# Patient Record
Sex: Female | Born: 1960 | Race: Black or African American | Hispanic: No | Marital: Single
Health system: Southern US, Community
[De-identification: ages and names within clinical notes are randomized; demographics above are authoritative.]

---

## 2013-01-14 ENCOUNTER — Telehealth: Payer: Self-pay | Admitting: Hematology & Oncology

## 2013-01-14 NOTE — Telephone Encounter (Signed)
Left vm w NEW PATIENT today to remind them of their appointment with Dr. Ennever. Also, advised them to bring all meds and insurance information. ° °

## 2013-01-17 ENCOUNTER — Telehealth: Payer: Self-pay | Admitting: Hematology & Oncology

## 2013-01-17 ENCOUNTER — Ambulatory Visit: Payer: Self-pay

## 2013-01-17 ENCOUNTER — Other Ambulatory Visit: Payer: Self-pay | Admitting: Lab

## 2013-01-17 ENCOUNTER — Ambulatory Visit: Payer: Self-pay | Admitting: Hematology & Oncology

## 2013-01-17 NOTE — Telephone Encounter (Signed)
Pt called rescheduled 11-7 no show to 12-12

## 2013-02-20 ENCOUNTER — Telehealth: Payer: Self-pay | Admitting: Hematology & Oncology

## 2013-02-20 NOTE — Telephone Encounter (Signed)
I spoke w NEW PATIENT today to remind them of their appointment with Dr. Ennever. Also, advised them to bring all meds and insurance information. ° °

## 2013-02-21 ENCOUNTER — Ambulatory Visit (HOSPITAL_BASED_OUTPATIENT_CLINIC_OR_DEPARTMENT_OTHER)
Admission: RE | Admit: 2013-02-21 | Discharge: 2013-02-21 | Disposition: A | Discharge status: Home / Self Care | Payer: Medicare Other | Source: Ambulatory Visit | Attending: Hematology & Oncology | Admitting: Hematology & Oncology

## 2013-02-21 ENCOUNTER — Other Ambulatory Visit (HOSPITAL_COMMUNITY)
Admission: RE | Admit: 2013-02-21 | Discharge: 2013-02-21 | Disposition: A | Discharge status: Home / Self Care | Payer: Medicare Other | Source: Ambulatory Visit | Attending: Hematology & Oncology | Admitting: Hematology & Oncology

## 2013-02-21 ENCOUNTER — Ambulatory Visit (HOSPITAL_BASED_OUTPATIENT_CLINIC_OR_DEPARTMENT_OTHER): Payer: Medicare Other | Admitting: Hematology & Oncology

## 2013-02-21 ENCOUNTER — Other Ambulatory Visit (HOSPITAL_BASED_OUTPATIENT_CLINIC_OR_DEPARTMENT_OTHER): Payer: Medicare Other | Admitting: Lab

## 2013-02-21 ENCOUNTER — Ambulatory Visit: Payer: Medicare Other

## 2013-02-21 VITALS — BP 129/71 | HR 80 | Temp 98.4°F | Resp 14 | Ht 64.0 in | Wt 172.0 lb

## 2013-02-21 DIAGNOSIS — D72820 Lymphocytosis (symptomatic): Secondary | ICD-10-CM

## 2013-02-21 DIAGNOSIS — R05 Cough: Secondary | ICD-10-CM | POA: Insufficient documentation

## 2013-02-21 DIAGNOSIS — R059 Cough, unspecified: Secondary | ICD-10-CM | POA: Insufficient documentation

## 2013-02-21 DIAGNOSIS — F17200 Nicotine dependence, unspecified, uncomplicated: Secondary | ICD-10-CM

## 2013-02-21 DIAGNOSIS — D72829 Elevated white blood cell count, unspecified: Secondary | ICD-10-CM | POA: Insufficient documentation

## 2013-02-21 LAB — CBC WITH DIFFERENTIAL (CANCER CENTER ONLY)
BASO#: 0 10e3/uL (ref 0.0–0.2)
BASO%: 0.2 % (ref 0.0–2.0)
EOS%: 1.4 % (ref 0.0–7.0)
Eosinophils Absolute: 0.2 10e3/uL (ref 0.0–0.5)
HCT: 38 % (ref 34.8–46.6)
HGB: 12.1 g/dL (ref 11.6–15.9)
LYMPH#: 6 10e3/uL — ABNORMAL HIGH (ref 0.9–3.3)
LYMPH%: 39.6 % (ref 14.0–48.0)
MCH: 27 pg (ref 26.0–34.0)
MCHC: 31.8 g/dL — ABNORMAL LOW (ref 32.0–36.0)
MCV: 85 fL (ref 81–101)
MONO#: 0.8 10e3/uL (ref 0.1–0.9)
MONO%: 5.4 % (ref 0.0–13.0)
NEUT#: 8.2 10e3/uL — ABNORMAL HIGH (ref 1.5–6.5)
NEUT%: 53.4 % (ref 39.6–80.0)
Platelets: 278 10e3/uL (ref 145–400)
RBC: 4.48 10e6/uL (ref 3.70–5.32)
RDW: 15.3 % (ref 11.1–15.7)
WBC: 15.3 10e3/uL — ABNORMAL HIGH (ref 3.9–10.0)

## 2013-02-21 LAB — CHCC SATELLITE - SMEAR

## 2013-02-21 NOTE — Progress Notes (Signed)
This office note has been dictated.

## 2013-02-24 ENCOUNTER — Telehealth: Payer: Self-pay | Admitting: Nurse Practitioner

## 2013-02-24 NOTE — Telephone Encounter (Addendum)
Message copied by Glee Arvin on Mon Feb 24, 2013  5:48 PM ------      Message from: Josph Macho      Created: Sun Feb 23, 2013  8:38 PM       Call - normal cxr.  Pete ------Informed pt and she verbalized understanding and appreciation.

## 2013-02-24 NOTE — Progress Notes (Signed)
CC:   Karen Schmidt, MD  DIAGNOSIS:  Leukocytosis.  HISTORY OF PRESENT ILLNESS:  Karen Rowland is a very nice 52 year old African American female.  She has insulin-dependent diabetes.  I think, she has this for about 4 to 6 years.  She is followed by Dr. Jola Rowland.  She has been noted to have an increase in white cells.  This has been noted, I think, earlier this year.  She is on quite a few medications. She does smoke.  She really had no problems with recurrent infections.  She has not noticed any swollen lymph glands.  Back in September of 2014, CBC was done which showed a white cell count of 14, hemoglobin 13, hematocrit 41, platelet count 256.  White cell differential shows 58 segs, 35 lymphocytes, 4 monos.  She has had no signs of weight gain.  She says she is getting heavy around the abdomen.  There has been no change in bowel or bladder habits.  She has not noticed any kind of rash.  She has had no swallowing difficulties.  She has had no cough.  She has had no, I think, change in her medications.  She did have an infected tooth, I think, a month or so ago.  This was now removed.  It was thought that maybe this was causing the elevated white cell count.  She was kindly referred to the [facility] for the leukocytosis.  PAST MEDICAL HISTORY:  Remarkable for, 1. Insulin dependent diabetes. 2. Hypertension. 3. Bipolar disorder. 4. Hysterectomy. 5. Anxiety.  ALLERGIES:  Penicillin.  MEDICATIONS:  Norvasc 5 mg p.o. daily, Lantus insulin 35 units subcu at bedtime, Cogentin 0.5 mg p.o. daily, Zestoretic (20/12.5) 1 p.o. daily, Prilosec 40 mg p.o. b.i.d., Ditropan 5 mg p.o. daily, Paxil 40 mg p.o. daily, Pravachol 10 mg p.o. daily, Risperdal 2 mg p.o. q.h.s.  SOCIAL HISTORY:  Remarkable for past tobacco use.  She probably has about a 30 pack years history of tobacco use.  She has rare alcohol use. She has no recreational drug use.  There is no obvious  occupational exposures.  FAMILY HISTORY:  Remarkable, I think, for sickle cell trait in a sister. I think, there is history of lung cancer in the family.  REVIEW OF SYSTEMS:  As stated in the history of present illness.  No additional findings noted on a 12-system review.  PHYSICAL EXAMINATION:  General:  This is a well developed, well- nourished African American female in no obvious distress.  Vital Signs: Temperature of 98.4, pulse 80, respiratory rate 14, blood pressure 129/71, weight is 172 pounds.  Head and Neck:  Shows a normocephalic, atraumatic skull.  There are no ocular or oral lesions.  There are no palpable cervical or supraclavicular lymph nodes.  Thyroid is nonpalpable.  Lungs:  Clear to percussion and auscultation bilaterally. Cardiac:  Regular rate and rhythm with a normal S1, S2.  There are no murmurs, rubs, or bruits.  Abdomen:  Soft.  She has good bowel sounds. There is no fluid wave.  There is no palpable abdominal mass.  There is no palpable hepatosplenomegaly.  Back:  Shows no tenderness over the spine, ribs, or hips.  Extremities:  Show no clubbing, cyanosis, or edema.  Skin:  No rashes, ecchymoses or petechia.  LABORATORY STUDIES:  White cell count 15.3, hemoglobin 12.1, hematocrit 38, platelet count 278.  White cell differential shows 53 segs, 40 lymphocytes, 5 monos.  Peripheral smear shows a normochromic, normocytic population of  red blood cells.  There is no target cells.  I see no rouleaux formation. There is no teardrop cells.  There is no schistocytes or spherocytes. White cells are mildly increased in number.  There may be a couple of smudge cells.  There may be a couple of atypical lymphocytes.  There are no hypersegmented polys.  I see no immature myeloid cells.  Again, there maybe a couple of atypical lymphocytes.  There are no blasts.  Platelets are adequate in number and size.  IMPRESSION:  Karen Rowland is a very charming 52 year old African  American female.  She has leukocytosis.  I will have to suspect that this is all related to her tobacco use.  I suppose it might be medication related.  I do not think this is a lymphoproliferative process.  However, we will send off for flow cytometry to rule out a monoclonal lymphocyte population.  I worry about her smoking.  I think she does need to have a chest x-ray done.  We actually did go ahead and get a chest x-ray on her today. This was negative for any mediastinal adenopathy or pulmonary parenchymal nodules.  She has no suspicious findings on physical exam that would suggest a lymphoproliferative process.  Again one would have to think that this leukocytosis is reactive.  I do want to get her back in another 4 months.  I think that if we find that her flow cytometry is positive, we will get her back sooner.  I do not see a need for a bone marrow biopsy on her right now.  I spent a good hour with Karen Rowland.  She is very nice.  She is quite funny and I certainly enjoyed talking with her.    ______________________________ Josph Macho, M.D. PRE/MEDQ  D:  02/21/2013  T:  02/22/2013  Job:  [id number]

## 2013-02-25 LAB — FLOW CYTOMETRY - CHCC SATELLITE

## 2013-03-03 ENCOUNTER — Telehealth: Payer: Self-pay

## 2013-03-03 NOTE — Telephone Encounter (Addendum)
Message copied by Leana Gamer on Mon Mar 03, 2013  9:13 AM ------      Message from: Arlan Organ R      Created: Wed Feb 26, 2013 10:19 PM       Call - No leukemia cells seen on tests.  Pete ------patient call and given the above message. Pt was please with result.

## 2013-05-21 ENCOUNTER — Telehealth: Payer: Self-pay | Admitting: Hematology & Oncology

## 2013-05-21 NOTE — Telephone Encounter (Signed)
Patient called and cx 05/23/13 apt and stated she will call back to resch

## 2013-05-23 ENCOUNTER — Other Ambulatory Visit: Payer: Medicare Other | Admitting: Lab

## 2013-05-23 ENCOUNTER — Ambulatory Visit: Payer: Medicare Other | Admitting: Hematology & Oncology

## 2015-01-03 IMAGING — CR DG CHEST 2V
2 series · 2 of 2 positions shown · non-contrast
Comparison: None.

CLINICAL DATA: Cough.

EXAM:
CHEST  2 VIEW

[w chest pa]
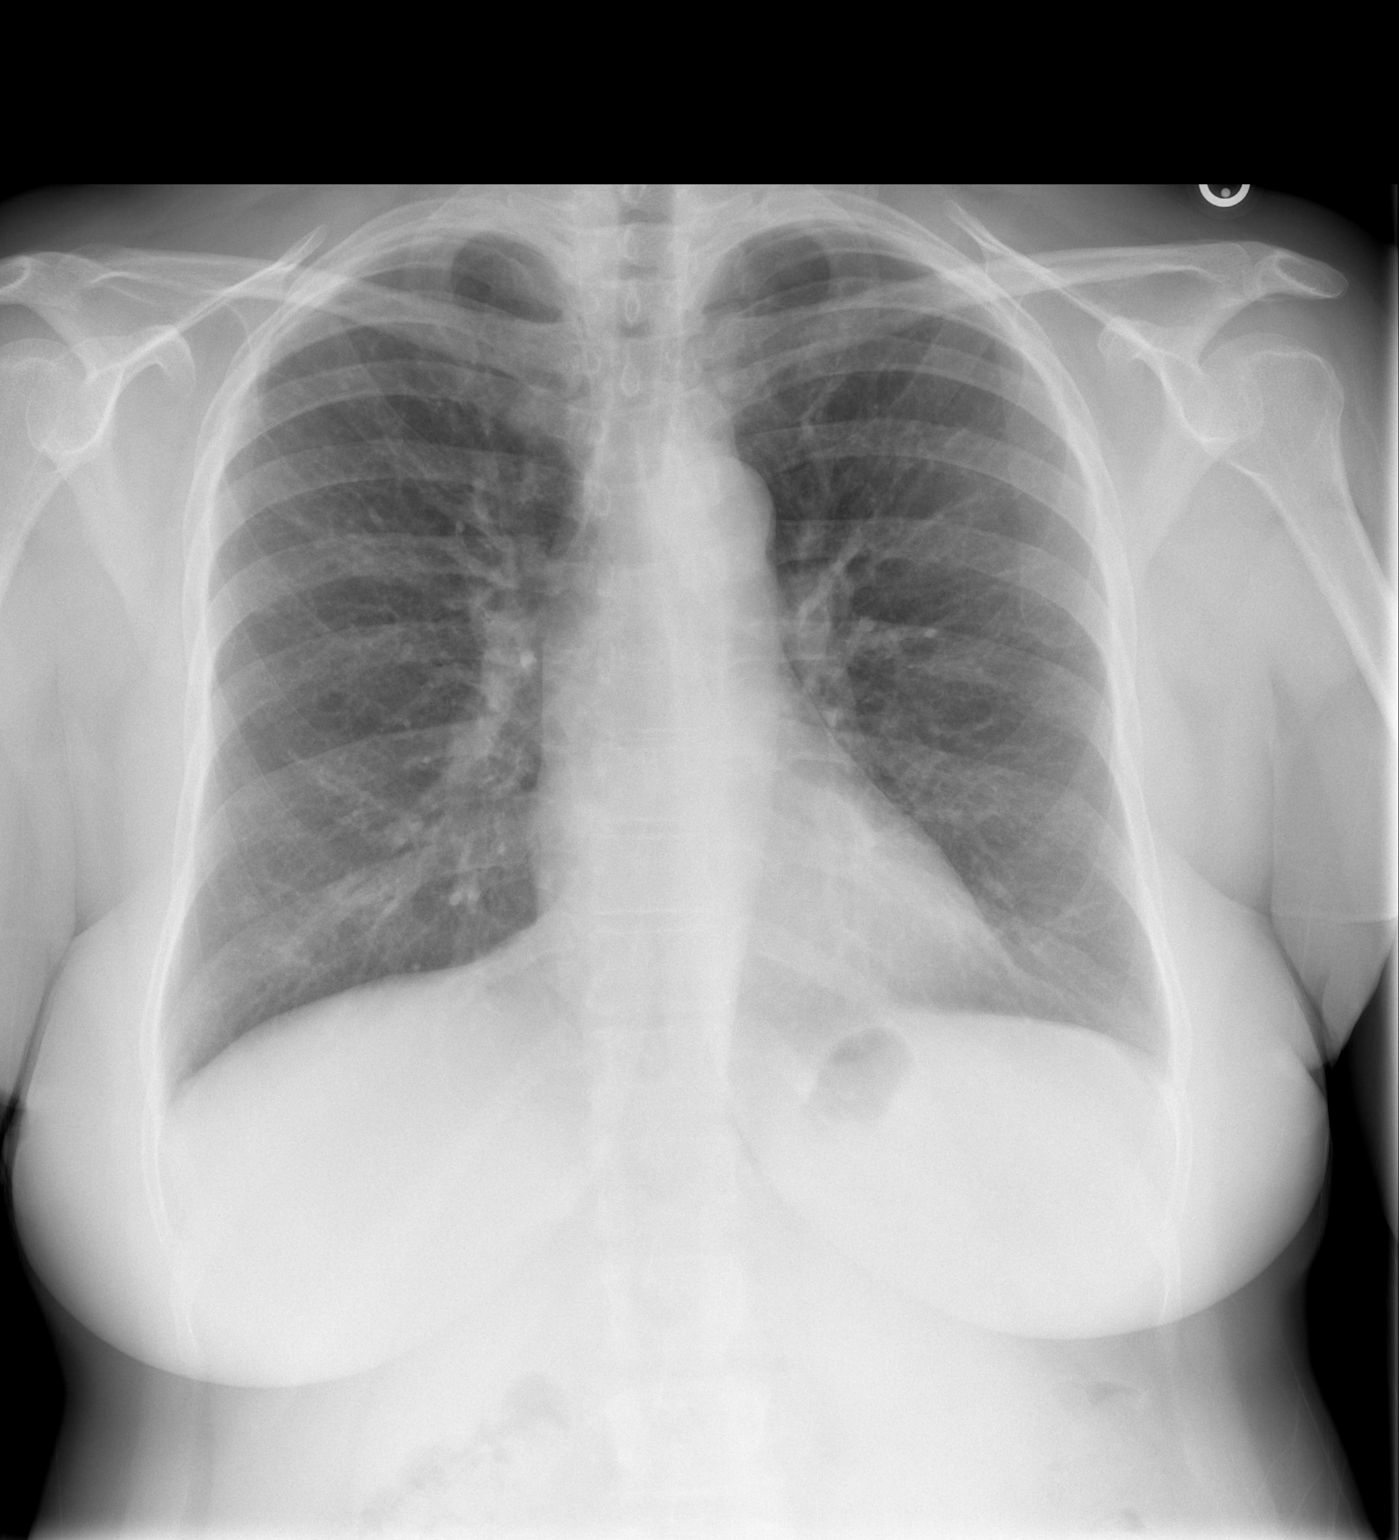

[w chest lat]
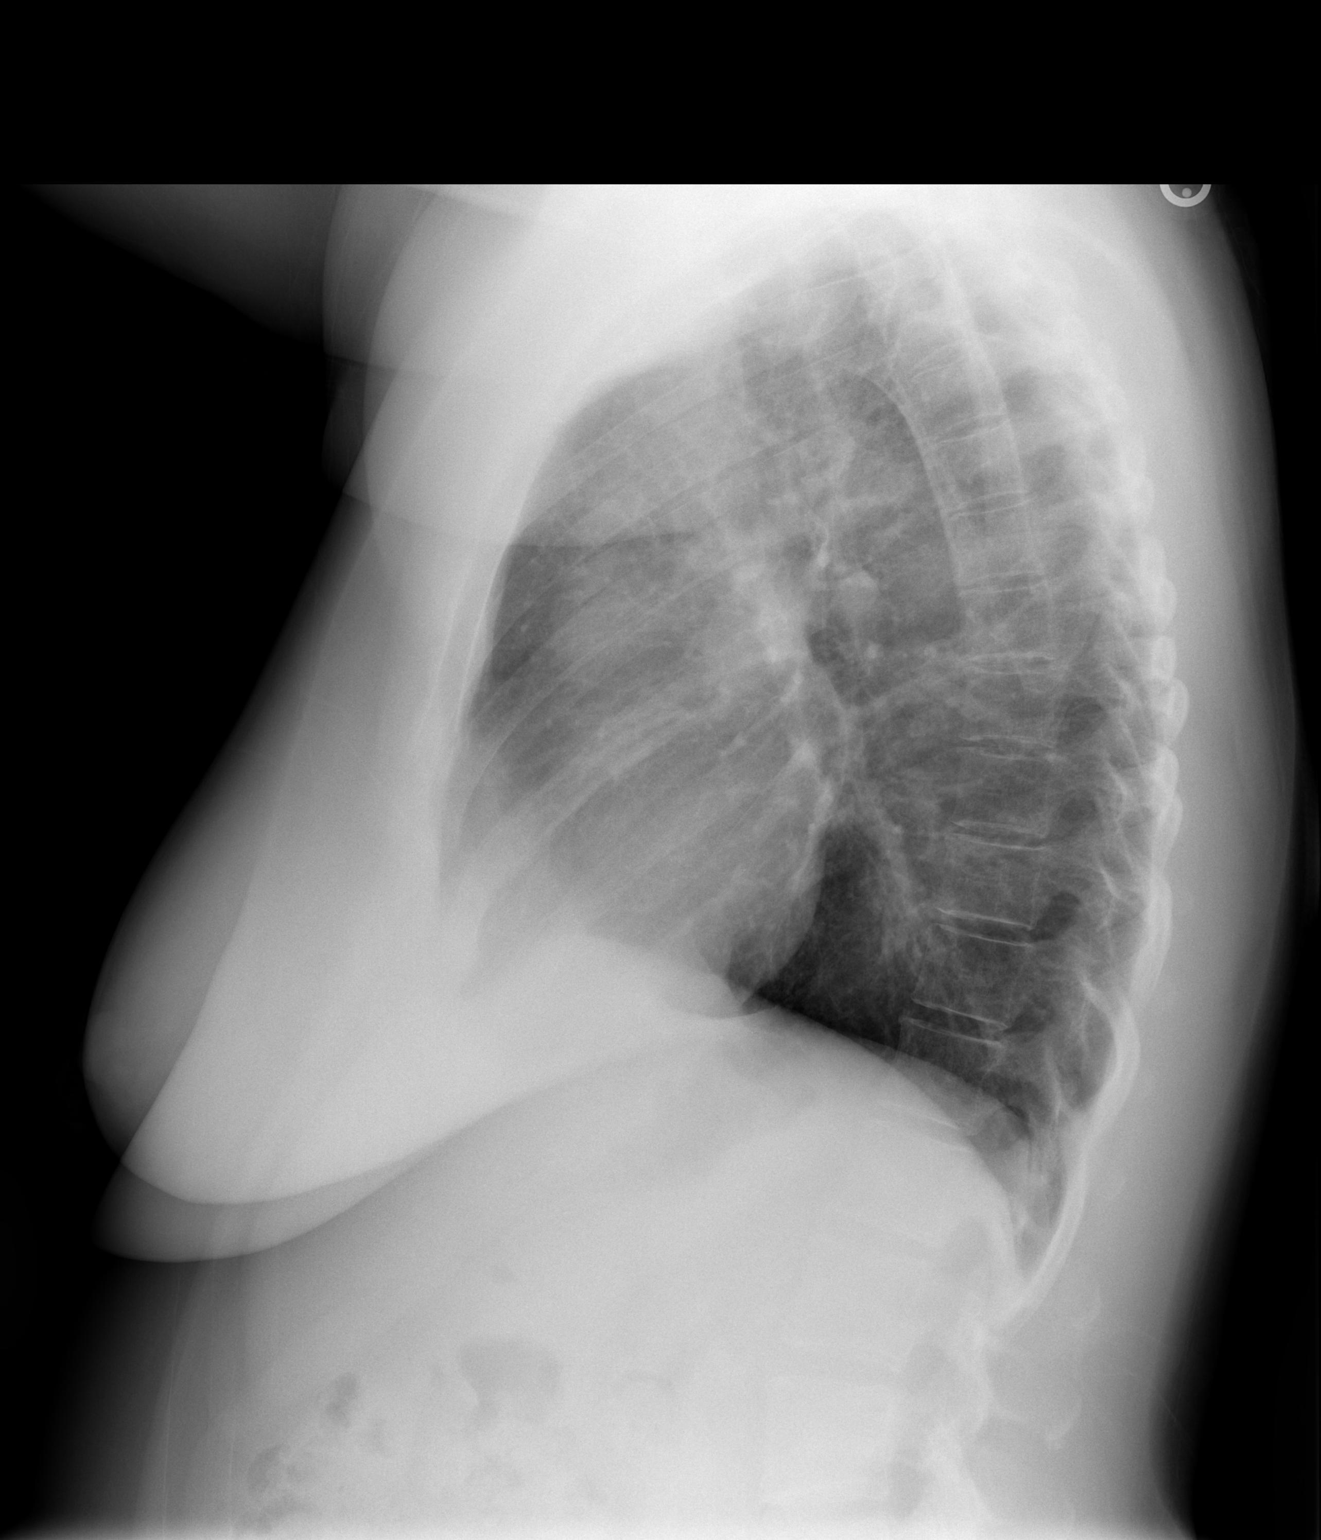

[2 of 2 positions shown; findings below may reference images not displayed]

FINDINGS: The heart size and mediastinal contours are within normal limits.
Both lungs are clear. No pleural effusion or pneumothorax is noted.
The visualized skeletal structures are unremarkable.
IMPRESSION: No acute cardiopulmonary abnormality seen.
